# Patient Record
Sex: Female | Born: 1970 | Race: White | Hispanic: Yes | Marital: Married | Smoking: Never smoker
Health system: Southern US, Community
[De-identification: ages and names within clinical notes are randomized; demographics above are authoritative.]

## PROBLEM LIST (undated history)

## (undated) DIAGNOSIS — E785 Hyperlipidemia, unspecified: Secondary | ICD-10-CM

## (undated) DIAGNOSIS — R109 Unspecified abdominal pain: Secondary | ICD-10-CM

## (undated) DIAGNOSIS — R202 Paresthesia of skin: Secondary | ICD-10-CM

## (undated) HISTORY — DX: Unspecified abdominal pain: R10.9

## (undated) HISTORY — PX: TUBAL LIGATION: SHX77

## (undated) HISTORY — DX: Hyperlipidemia, unspecified: E78.5

## (undated) HISTORY — DX: Paresthesia of skin: R20.2

---

## 1998-01-20 ENCOUNTER — Inpatient Hospital Stay (HOSPITAL_COMMUNITY): Admission: AD | Admit: 1998-01-20 | Discharge: 1998-01-20 | Payer: Self-pay | Admitting: Obstetrics

## 1998-01-21 ENCOUNTER — Ambulatory Visit (HOSPITAL_COMMUNITY): Admission: RE | Admit: 1998-01-21 | Discharge: 1998-01-21 | Payer: Self-pay | Admitting: Obstetrics & Gynecology

## 1998-11-25 ENCOUNTER — Ambulatory Visit (HOSPITAL_COMMUNITY): Admission: AD | Admit: 1998-11-25 | Discharge: 1998-11-25 | Payer: Self-pay | Admitting: *Deleted

## 1999-01-21 ENCOUNTER — Ambulatory Visit (HOSPITAL_COMMUNITY): Admission: RE | Admit: 1999-01-21 | Discharge: 1999-01-21 | Payer: Self-pay | Admitting: *Deleted

## 1999-05-21 ENCOUNTER — Inpatient Hospital Stay (HOSPITAL_COMMUNITY): Admission: AD | Admit: 1999-05-21 | Discharge: 1999-05-23 | Payer: Self-pay | Admitting: *Deleted

## 1999-05-21 ENCOUNTER — Encounter (INDEPENDENT_AMBULATORY_CARE_PROVIDER_SITE_OTHER): Payer: Self-pay | Admitting: Specialist

## 2000-03-08 ENCOUNTER — Ambulatory Visit (HOSPITAL_COMMUNITY): Admission: RE | Admit: 2000-03-08 | Discharge: 2000-03-08 | Payer: Self-pay | Admitting: Family Medicine

## 2002-06-24 ENCOUNTER — Emergency Department (HOSPITAL_COMMUNITY): Admission: EM | Admit: 2002-06-24 | Discharge: 2002-06-24 | Payer: Self-pay | Admitting: Emergency Medicine

## 2002-06-24 ENCOUNTER — Encounter: Payer: Self-pay | Admitting: Emergency Medicine

## 2002-07-21 ENCOUNTER — Encounter: Payer: Self-pay | Admitting: Family Medicine

## 2002-07-21 ENCOUNTER — Encounter: Admission: RE | Admit: 2002-07-21 | Discharge: 2002-07-21 | Payer: Self-pay | Admitting: Family Medicine

## 2002-10-08 ENCOUNTER — Encounter: Payer: Self-pay | Admitting: Family Medicine

## 2002-10-08 ENCOUNTER — Encounter: Admission: RE | Admit: 2002-10-08 | Discharge: 2002-10-08 | Payer: Self-pay | Admitting: Family Medicine

## 2003-04-27 ENCOUNTER — Encounter: Admission: RE | Admit: 2003-04-27 | Discharge: 2003-04-27 | Payer: Self-pay | Admitting: Specialist

## 2003-04-27 ENCOUNTER — Encounter: Payer: Self-pay | Admitting: Specialist

## 2003-09-11 ENCOUNTER — Encounter: Admission: RE | Admit: 2003-09-11 | Discharge: 2003-09-11 | Payer: Self-pay | Admitting: Family Medicine

## 2004-06-14 ENCOUNTER — Ambulatory Visit (HOSPITAL_COMMUNITY): Admission: RE | Admit: 2004-06-14 | Discharge: 2004-06-14 | Payer: Self-pay | Admitting: Family Medicine

## 2006-01-24 ENCOUNTER — Encounter: Payer: Self-pay | Admitting: Family Medicine

## 2006-07-16 ENCOUNTER — Emergency Department (HOSPITAL_COMMUNITY): Admission: EM | Admit: 2006-07-16 | Discharge: 2006-07-16 | Payer: Self-pay | Admitting: Emergency Medicine

## 2007-04-23 ENCOUNTER — Encounter: Admission: RE | Admit: 2007-04-23 | Discharge: 2007-04-23 | Payer: Self-pay | Admitting: Family Medicine

## 2008-07-17 ENCOUNTER — Encounter: Admission: RE | Admit: 2008-07-17 | Discharge: 2008-07-17 | Payer: Self-pay | Admitting: Family Medicine

## 2009-01-13 IMAGING — CR DG NASAL BONES 3+V
3 series · 3 of 3 positions shown · non-contrast
Comparison: None

CLINICAL DATA: Indentation bridge of the nose

NASAL BONES - 3+ VIEW

[w waters *]
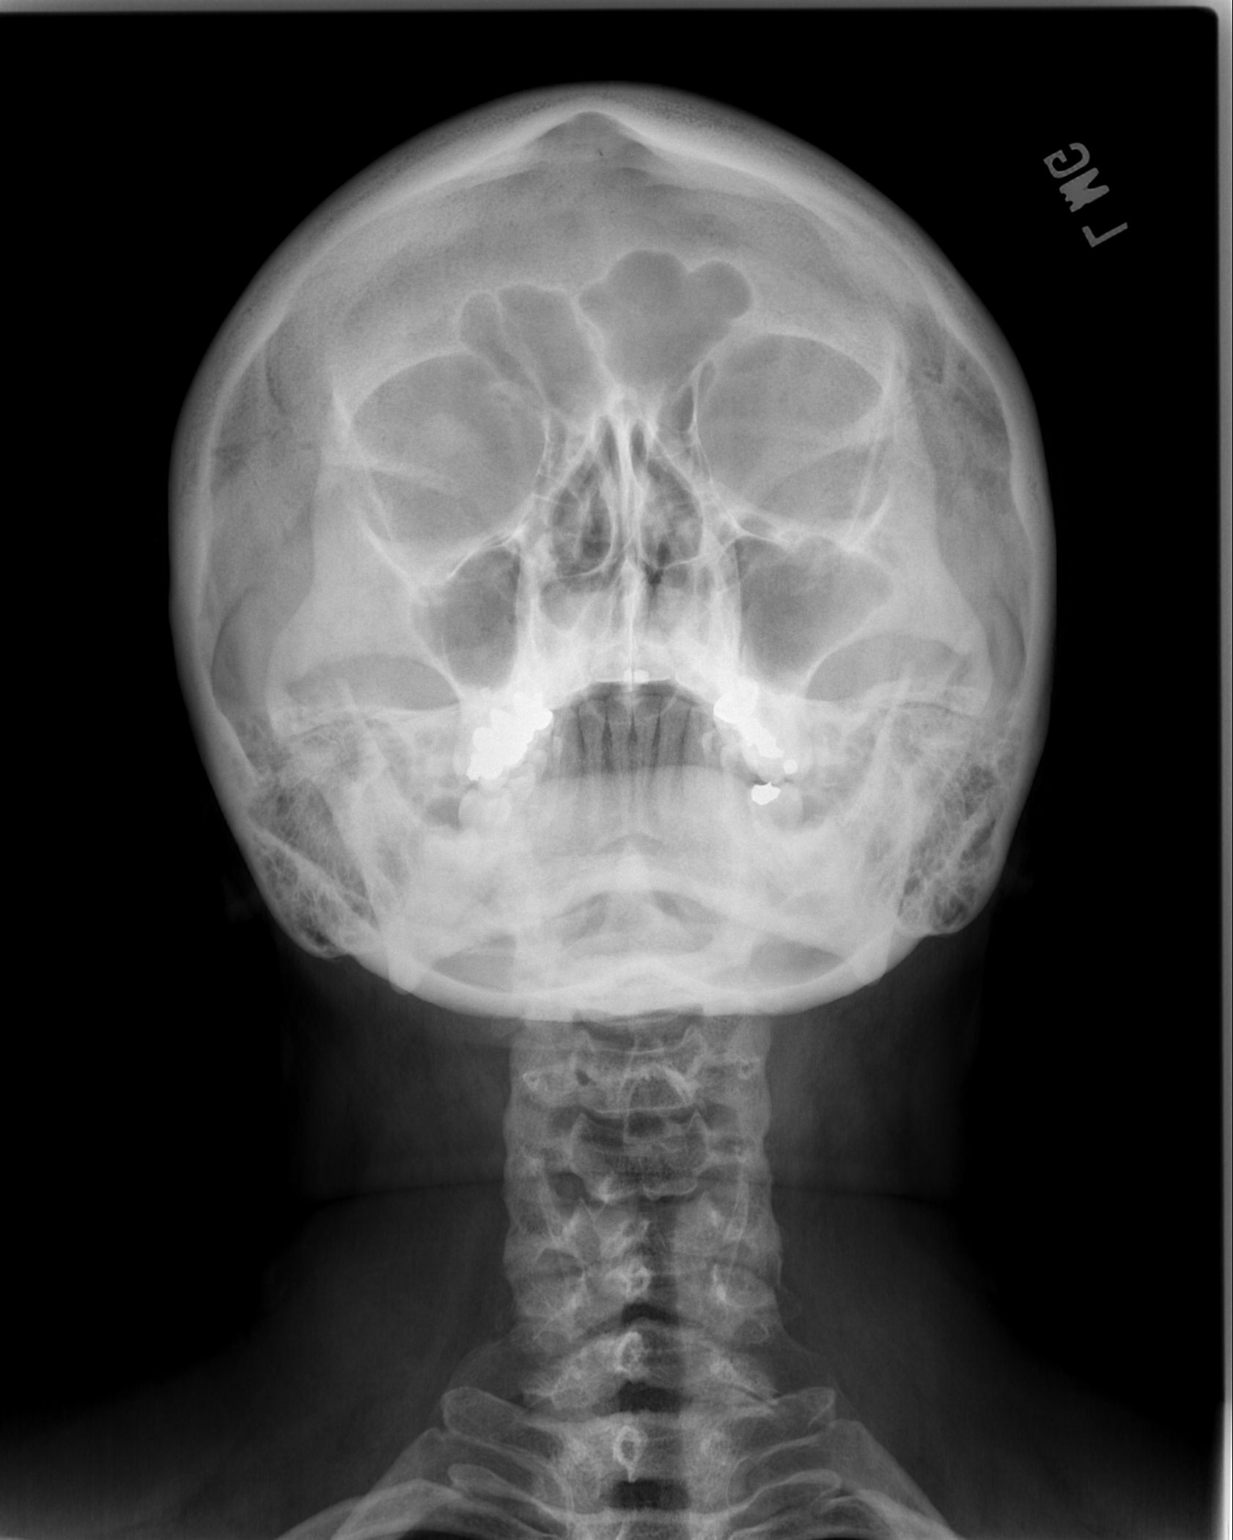

[w nasal bone lat * (1 of 2)]
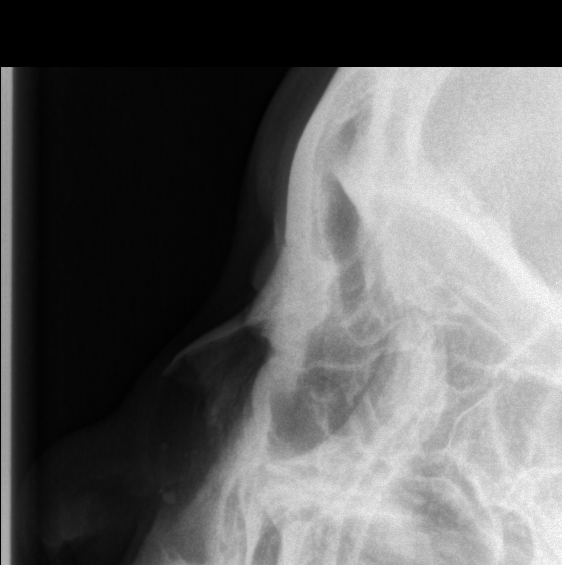

[w nasal bone lat * (2 of 2)]
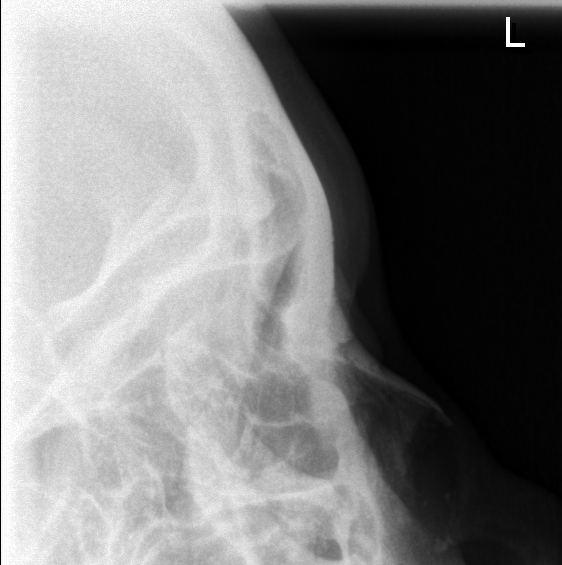

[3 of 3 positions shown; findings below may reference images not displayed]

FINDINGS: No traumatic fluid in the sinuses.  No evidence of nasal
fracture.
IMPRESSION: Negative

## 2010-09-11 ENCOUNTER — Emergency Department (HOSPITAL_COMMUNITY)
Admission: EM | Admit: 2010-09-11 | Discharge: 2010-09-11 | Payer: Self-pay | Source: Home / Self Care | Admitting: Emergency Medicine

## 2010-10-16 ENCOUNTER — Encounter: Payer: Self-pay | Admitting: Family Medicine

## 2014-12-29 ENCOUNTER — Other Ambulatory Visit: Payer: Self-pay | Admitting: Occupational Medicine

## 2014-12-29 ENCOUNTER — Emergency Department (HOSPITAL_COMMUNITY)
Admission: EM | Admit: 2014-12-29 | Discharge: 2014-12-29 | Disposition: A | Discharge status: Home / Self Care | Payer: Self-pay | Source: Home / Self Care

## 2014-12-29 ENCOUNTER — Ambulatory Visit: Payer: Self-pay

## 2014-12-29 DIAGNOSIS — R52 Pain, unspecified: Secondary | ICD-10-CM

## 2019-10-16 ENCOUNTER — Encounter: Payer: Self-pay | Admitting: Physician Assistant

## 2019-10-28 ENCOUNTER — Ambulatory Visit: Payer: Self-pay | Admitting: Physician Assistant

## 2019-11-03 ENCOUNTER — Telehealth: Payer: Self-pay | Admitting: *Deleted

## 2019-11-03 ENCOUNTER — Ambulatory Visit: Payer: BC Managed Care – PPO | Admitting: Physician Assistant

## 2019-11-03 ENCOUNTER — Encounter: Payer: Self-pay | Admitting: Physician Assistant

## 2019-11-03 ENCOUNTER — Other Ambulatory Visit: Payer: Self-pay

## 2019-11-03 VITALS — BP 96/66 | HR 68 | Temp 98.1°F | Ht 60.0 in | Wt 145.0 lb

## 2019-11-03 DIAGNOSIS — K5900 Constipation, unspecified: Secondary | ICD-10-CM | POA: Diagnosis not present

## 2019-11-03 DIAGNOSIS — R141 Gas pain: Secondary | ICD-10-CM | POA: Diagnosis not present

## 2019-11-03 DIAGNOSIS — R140 Abdominal distension (gaseous): Secondary | ICD-10-CM | POA: Diagnosis not present

## 2019-11-03 DIAGNOSIS — E785 Hyperlipidemia, unspecified: Secondary | ICD-10-CM | POA: Insufficient documentation

## 2019-11-03 DIAGNOSIS — Z01818 Encounter for other preprocedural examination: Secondary | ICD-10-CM

## 2019-11-03 DIAGNOSIS — R142 Eructation: Secondary | ICD-10-CM

## 2019-11-03 DIAGNOSIS — Z1211 Encounter for screening for malignant neoplasm of colon: Secondary | ICD-10-CM

## 2019-11-03 NOTE — Telephone Encounter (Signed)
Left message for patient to call office. Need pharmacy so we can send in Suprep for patient.

## 2019-11-03 NOTE — Patient Instructions (Addendum)
If you are age 49 or older, your body mass index should be between 23-30. Your Body mass index is 28.32 kg/m. If this is out of the aforementioned range listed, please consider follow up with your Primary Care Provider.  If you are age 33 or younger, your body mass index should be between 19-25. Your Body mass index is 28.32 kg/m. If this is out of the aformentioned range listed, please consider follow up with your Primary Care Provider.   We have sent the following medications to your pharmacy for you to pick up at your convenience: Protonix 40 mg daily.   Stop Carafate.   Try Beano 2-3 drops on food with each meal.  Continue Benefiber daily.   You have been scheduled for an endoscopy and colonoscopy. Please follow the written instructions given to you at your visit today. Please pick up your prep supplies at the pharmacy within the next 1-3 days. If you use inhalers (even only as needed), please bring them with you on the day of your procedure.

## 2019-11-03 NOTE — Progress Notes (Signed)
Subjective:    Patient ID: Laurie Valdez, female    DOB: 11/03/1970, 49 y.o.   MRN: [id number]  HPI Laurie Valdez is a pleasant 49 year old Hispanic female, new to GI today referred by Dr. Trula Ore with complaints of abdominal pain. Patient says she has had her current symptoms over about the past 5 years but feels that symptoms are becoming worse and now occurring on a daily basis.  She is primarily complaining of abdominal bloating postprandially associated with gas and belching.  She will at times have pain in her mid abdomen or in her right abdomen.  She denies any heartburn or dysphagia.  She says she was told she had ulcers at one point but has never had a GI evaluation.  It sounds as if she may have been tested for H. pylori at one point but does not recall whether or not she took antibiotics.  Appetite has been fine, weight has been stable.  She also has problems with chronic constipation.  She takes fiber supplements on a daily basis and usually has bowel movement every 1 to 2 days, no melena or hematochezia. She says she gets very uncomfortable with the abdominal bloating at times.  Symptoms are worse with spicy foods beings and cactus. She does not drink any carbonated beverages, no artificial sweeteners, avoids dairy and no EtOH.  Family history is negative for colon cancer and polyps as far she is aware. She has history of lumbar radiculopathy and had taken Voltaren pretty regularly at one point but is no longer using.  Review of Systems Pertinent positive and negative review of systems were noted in the above HPI section.  All other review of systems was otherwise negative.  Outpatient Encounter Medications as of 11/03/2019  Medication Sig  . diclofenac (VOLTAREN) 75 MG EC tablet Take 75 mg by mouth 2 (two) times daily. Takes as needed  . rosuvastatin (CRESTOR) 10 MG tablet Take 10 mg by mouth daily.  . sucralfate (CARAFATE) 1 g tablet Take 1 g by mouth 4 (four) times daily -   with meals and at bedtime. Patient only taking 1-2 times daily   No facility-administered encounter medications on file as of 11/03/2019.   No Known Allergies Patient Active Problem List   Diagnosis Date Noted  . Hyperlipidemia 11/03/2019   Social History   Socioeconomic History  . Marital status: Married    Spouse name: Not on file  . Number of children: Not on file  . Years of education: Not on file  . Highest education level: Not on file  Occupational History  . Not on file  Tobacco Use  . Smoking status: Never Smoker  . Smokeless tobacco: Never Used  Substance and Sexual Activity  . Alcohol use: Yes    Comment: occasional-rare  . Drug use: Never  . Sexual activity: Not on file  Other Topics Concern  . Not on file  Social History Narrative  . Not on file   Social Determinants of Health   Financial Resource Strain:   . Difficulty of Paying Living Expenses: Not on file  Food Insecurity:   . Worried About Charity fundraiser in the Last Year: Not on file  . Ran Out of Food in the Last Year: Not on file  Transportation Needs:   . Lack of Transportation (Medical): Not on file  . Lack of Transportation (Non-Medical): Not on file  Physical Activity:   . Days of Exercise per Week: Not on file  .  Minutes of Exercise per Session: Not on file  Stress:   . Feeling of Stress : Not on file  Social Connections:   . Frequency of Communication with Friends and Family: Not on file  . Frequency of Social Gatherings with Friends and Family: Not on file  . Attends Religious Services: Not on file  . Active Member of Clubs or Organizations: Not on file  . Attends Banker Meetings: Not on file  . Marital Status: Not on file  Intimate Partner Violence:   . Fear of Current or Ex-Partner: Not on file  . Emotionally Abused: Not on file  . Physically Abused: Not on file  . Sexually Abused: Not on file    Laurie Valdez's Family history is unknown by patient.       Objective:    Vitals:   11/03/19 1512  BP: 96/66  Pulse: 68  Temp: 98.1 F (36.7 C)    Physical Exam Well-developed well-nourished Hispanic in no acute distress.  Patient is accompanied by an interpreter  Weight, 145 BMI 28.3  HEENT; nontraumatic normocephalic, EOMI, PER R LA, sclera anicteric. Oropharynx; not examined Neck; supple, no JVD Cardiovascular; regular rate and rhythm with S1-S2, no murmur rub or gallop Pulmonary; Clear bilaterally Abdomen; soft, she is mildly tender in the epigastrium and right upper quadrant,, nondistended, no palpable mass or hepatosplenomegaly, bowel sounds are active Rectal; not done today Skin; benign exam, no jaundice rash or appreciable lesions Extremities; no clubbing cyanosis or edema skin warm and dry Neuro/Psych; alert and oriented x4, grossly nonfocal mood and affect appropriate       Assessment & Plan:   #5 49 year old non-English-speaking Hispanic female with 5-year history of somewhat progressive postprandial abdominal bloating and discomfort with gassiness and belching. Etiology not clear, rule out chronic gastropathy, H. pylori, functional dyspepsia, consider gallbladder disease.  Rule out IBS  #2 chronic mild constipation #3 colon cancer screening-no prior colonoscopy.  Plan; start low gas diet. Start trial of Protonix 40 mg p.o. every morning. Trial of Beano 2 to 3 drops on food with each meal Continue Benefiber for fiber supplementation. Patient will be scheduled for colonoscopy and EGD with Dr. Lavon Paganini .  Both procedures were discussed in detail with the patient including indications risks and benefits and she is agreeable to proceed. If endoscopic evaluation is unremarkable and symptoms persisting will need imaging with ultrasound initially   S  PA-C 11/03/2019   Cc: Aletha Halim, MD

## 2019-11-04 ENCOUNTER — Ambulatory Visit (INDEPENDENT_AMBULATORY_CARE_PROVIDER_SITE_OTHER): Payer: BC Managed Care – PPO

## 2019-11-04 ENCOUNTER — Other Ambulatory Visit: Payer: Self-pay

## 2019-11-04 DIAGNOSIS — Z1159 Encounter for screening for other viral diseases: Secondary | ICD-10-CM

## 2019-11-04 MED ORDER — NA SULFATE-K SULFATE-MG SULF 17.5-3.13-1.6 GM/177ML PO SOLN: 1.0000 | Freq: Once | ORAL | 0 refills | Status: AC

## 2019-11-04 MED ORDER — PANTOPRAZOLE SODIUM 40 MG PO TBEC: 40.0000 mg | DELAYED_RELEASE_TABLET | Freq: Every day | ORAL | 3 refills | Status: AC

## 2019-11-04 NOTE — Telephone Encounter (Signed)
Sent Protonix and Suprep to [facility]. Left message for patient to call office.

## 2019-11-05 ENCOUNTER — Telehealth: Payer: Self-pay | Admitting: Gastroenterology

## 2019-11-05 LAB — SARS CORONAVIRUS 2 (TAT 6-24 HRS): SARS Coronavirus 2: NEGATIVE

## 2019-11-05 NOTE — Telephone Encounter (Signed)
Informed patients husband that the free sample of Suprep is left at front desk for patient to pick up. Patient's husband verbalized understanding.

## 2019-11-05 NOTE — Telephone Encounter (Signed)
Patient called the office today asking for a different prep or a sample. Marchelle Folks spoke with patient's husband and informed him that we had a sample available and it was placed up front for her to pick up.

## 2019-11-06 ENCOUNTER — Encounter: Payer: Self-pay | Admitting: Gastroenterology

## 2019-11-06 ENCOUNTER — Other Ambulatory Visit: Payer: Self-pay

## 2019-11-06 ENCOUNTER — Ambulatory Visit (AMBULATORY_SURGERY_CENTER): Payer: BC Managed Care – PPO | Admitting: Gastroenterology

## 2019-11-06 VITALS — BP 113/70 | HR 68 | Resp 22 | Ht 60.0 in | Wt 145.0 lb

## 2019-11-06 DIAGNOSIS — K219 Gastro-esophageal reflux disease without esophagitis: Secondary | ICD-10-CM | POA: Diagnosis not present

## 2019-11-06 DIAGNOSIS — R1013 Epigastric pain: Secondary | ICD-10-CM

## 2019-11-06 DIAGNOSIS — R140 Abdominal distension (gaseous): Secondary | ICD-10-CM | POA: Diagnosis present

## 2019-11-06 DIAGNOSIS — Z1211 Encounter for screening for malignant neoplasm of colon: Secondary | ICD-10-CM | POA: Diagnosis not present

## 2019-11-06 MED ORDER — SODIUM CHLORIDE 0.9 % IV SOLN: 500.0000 mL | Freq: Once | INTRAVENOUS | Status: DC

## 2019-11-06 NOTE — Progress Notes (Signed)
Reviewed and agree with documentation and assessment and plan. K. Veena Jamison Yuhasz , MD   

## 2019-11-06 NOTE — Progress Notes (Signed)
PT taken to PACU. Monitors in place. VSS. Report given to RN. 

## 2019-11-06 NOTE — Op Note (Signed)
[facility] Patient Name: Laurie Valdez Procedure Date: 11/06/2019 8:00 AM MRN: [id number] Endoscopist: Napoleon Form , MD Age: 49 Referring MD:  Date of Birth: November 25, 1970 Gender: Female Account #: [id number] Procedure:                Colonoscopy Indications:              Screening for colorectal malignant neoplasm Medicines:                Monitored Anesthesia Care Procedure:                Pre-Anesthesia Assessment:                           - Prior to the procedure, a History and Physical                            was performed, and patient medications and                            allergies were reviewed. The patient's tolerance of                            previous anesthesia was also reviewed. The risks                            and benefits of the procedure and the sedation                            options and risks were discussed with the patient.                            All questions were answered, and informed consent                            was obtained. Prior Anticoagulants: The patient has                            taken no previous anticoagulant or antiplatelet                            agents. ASA Grade Assessment: II - A patient with                            mild systemic disease. After reviewing the risks                            and benefits, the patient was deemed in                            satisfactory condition to undergo the procedure.                           After obtaining informed consent, the colonoscope  was passed under direct vision. Throughout the                            procedure, the patient's blood pressure, pulse, and                            oxygen saturations were monitored continuously. The                            Colonoscope was introduced through the anus and                            advanced to the the cecum, identified by                            appendiceal  orifice and ileocecal valve. The                            colonoscopy was performed without difficulty. The                            patient tolerated the procedure well. The quality                            of the bowel preparation was excellent. The                            ileocecal valve, appendiceal orifice, and rectum                            were photographed. Scope In: 8:16:54 AM Scope Out: 8:34:38 AM Scope Withdrawal Time: 0 hours 10 minutes 5 seconds  Total Procedure Duration: 0 hours 17 minutes 44 seconds  Findings:                 The perianal and digital rectal examinations were                            normal.                           Non-bleeding internal hemorrhoids were found during                            retroflexion. The hemorrhoids were small.                           A few small-mouthed diverticula were found in the                            sigmoid colon.                           The exam was otherwise without abnormality. Complications:            No immediate complications. Estimated Blood Loss:  Estimated blood loss was minimal. Impression:               - Non-bleeding internal hemorrhoids.                           - Diverticulosis in the sigmoid colon.                           - The examination was otherwise normal.                           - No specimens collected. Recommendation:           - Patient has a contact number available for                            emergencies. The signs and symptoms of potential                            delayed complications were discussed with the                            patient. Return to normal activities tomorrow.                            Written discharge instructions were provided to the                            patient.                           - Resume previous diet.                           - Continue present medications.                           - Repeat colonoscopy in 10 years for  screening                            purposes. Napoleon Form, MD 11/06/2019 8:42:14 AM This report has been signed electronically.

## 2019-11-06 NOTE — Progress Notes (Signed)
Vitals-DT Temp-JB  History reviewed. 

## 2019-11-06 NOTE — Op Note (Signed)
[facility] Patient Name: Laurie Valdez Procedure Date: 11/06/2019 8:02 AM MRN: [id number] Endoscopist: Napoleon Form , MD Age: 49 Referring MD:  Date of Birth: 1970-11-02 Gender: Female Account #: [id number] Procedure:                Upper GI endoscopy Indications:              Dyspepsia, Esophageal reflux symptoms that persist                            despite appropriate therapy Medicines:                Monitored Anesthesia Care Procedure:                Pre-Anesthesia Assessment:                           - Prior to the procedure, a History and Physical                            was performed, and patient medications and                            allergies were reviewed. The patient's tolerance of                            previous anesthesia was also reviewed. The risks                            and benefits of the procedure and the sedation                            options and risks were discussed with the patient.                            All questions were answered, and informed consent                            was obtained. Prior Anticoagulants: The patient has                            taken no previous anticoagulant or antiplatelet                            agents. ASA Grade Assessment: II - A patient with                            mild systemic disease. After reviewing the risks                            and benefits, the patient was deemed in                            satisfactory condition to undergo the procedure.  After obtaining informed consent, the endoscope was                            passed under direct vision. Throughout the                            procedure, the patient's blood pressure, pulse, and                            oxygen saturations were monitored continuously. The                            Endoscope was introduced through the mouth, and                            advanced to  the second part of duodenum. The upper                            GI endoscopy was accomplished without difficulty.                            The patient tolerated the procedure well. Scope In: Scope Out: Findings:                 The esophagus was normal.                           The Z-line was regular and was found 35 cm from the                            incisors.                           A small hiatal hernia was present.                           The stomach was normal.                           The cardia and gastric fundus were normal on                            retroflexion.                           The examined duodenum was normal. Complications:            No immediate complications. Estimated Blood Loss:     Estimated blood loss: none. Impression:               - Normal esophagus.                           - Z-line regular, 35 cm from the incisors.                           - Small hiatal hernia.                           -  Normal stomach.                           - Normal examined duodenum.                           - No specimens collected. Recommendation:           - Patient has a contact number available for                            emergencies. The signs and symptoms of potential                            delayed complications were discussed with the                            patient. Return to normal activities tomorrow.                            Written discharge instructions were provided to the                            patient.                           - Resume previous diet.                           - Continue present medications.                           - See the other procedure note for documentation of                            additional recommendations. [name], MD 11/06/2019 8:40:43 AM This report has been signed electronically.

## 2019-11-06 NOTE — Patient Instructions (Signed)
Information on diverticulosis and hemorrhoids given to you today.  Repeat colonoscopy in 10 years.  USTED TUVO UN PROCEDIMIENTO ENDOSCPICO HOY EN EL [facility]:   Lea el informe del procedimiento que se le entreg para cualquier pregunta especfica sobre lo que se Dentist.  Si el informe del examen no responde a sus preguntas, por favor llame a su gastroenterlogo para aclararlo.  Si usted solicit que no se le den [employer] de lo que se Clinical cytogeneticist en su procedimiento al [employer] va a cuidar, entonces el informe del procedimiento se ha incluido en un sobre sellado para que usted lo revise despus cuando le sea ms conveniente.   LO QUE PUEDE ESPERAR: Algunas sensaciones de hinchazn en el abdomen.  Puede tener ms gases de lo normal.  El caminar puede ayudarle a eliminar el aire que se le puso en el tracto gastrointestinal durante el procedimiento y reducir la hinchazn.  Si le hicieron una endoscopia inferior (como una colonoscopia o una sigmoidoscopia flexible), podra notar manchas de sangre en las heces fecales o en el papel higinico.  Si se someti a una preparacin intestinal para su procedimiento, es posible que no tenga una evacuacin intestinal normal durante [employer].   Tenga en cuenta:  Es posible que note un poco de irritacin y congestin en la nariz o algn drenaje.  Esto es debido al oxgeno [employer] durante su procedimiento.  No hay que preocuparse y esto debe desaparecer ms o Regulatory affairs officer.   SNTOMAS PARA REPORTAR INMEDIATAMENTE:  Despus de una endoscopia inferior (colonoscopia o sigmoidoscopia flexible):  Cantidades excesivas de sangre en las heces fecales  Sensibilidad significativa o empeoramiento de los dolores abdominales   Hinchazn aguda del abdomen que antes no tena   Fiebre de 100F o ms   Despus de la endoscopia superior (EGD)  Vmitos de Retail buyer o material como caf molido   Dolor en el pecho o dolor debajo de los  omplatos que antes no tena   Dolor o dificultad persistente para tragar  Falta de aire que antes no tena   Fiebre de 100F o ms  Heces fecales negras y pegajosas   Para asuntos urgentes o de Associate Professor, puede comunicarse con un gastroenterlogo a cualquier hora llamando al [phone].  DIETA:  Recomendamos una comida pequea al principio, pero luego puede continuar con su dieta normal.  Tome muchos lquidos, Tax adviser las bebidas alcohlicas durante 24 horas.    ACTIVIDAD:  Debe planear tomarse las cosas con calma por el resto del da y no debe CONDUCIR ni usar maquinaria pesada Patent examiner (debido a los medicamentos de sedacin utilizados durante el examen).     SEGUIMIENTO: Nuestro personal llamar al nmero que aparece en su historial al siguiente da hbil de su procedimiento para ver cmo se siente y para responder cualquier pregunta o inquietud que pueda tener con respecto a la informacin que se le dio despus del procedimiento. Si no podemos contactarle, le dejaremos un mensaje.  Sin embargo, si se siente bien y no tiene English as a second language teacher, no es necesario que nos devuelva la llamada.  Asumiremos que ha regresado a sus actividades diarias normales sin incidentes. Si se le tomaron algunas biopsias, le contactaremos por telfono o por carta en las prximas 3 semanas.  Si no ha sabido [employer] biopsias en el transcurso de 3 semanas, por favor llmenos al [phone].   FIRMAS/CONFIDENCIALIDAD: Usted y/o el acompaante que le cuide [place]  firmado documentos que se ingresarn en su historial mdico electrnico.  Estas firmas atestiguan el hecho de que la informacin anterior

## 2019-11-10 ENCOUNTER — Telehealth: Payer: Self-pay | Admitting: *Deleted

## 2019-11-10 ENCOUNTER — Telehealth: Payer: Self-pay

## 2019-11-10 NOTE — Telephone Encounter (Signed)
Attempted f/u phone call. No answer. Mailbox full, unable to leave message.  

## 2019-11-10 NOTE — Telephone Encounter (Signed)
  Follow up Call-  Call back number 11/06/2019  Post procedure Call Back phone  # [phone]  Permission to leave phone message Yes  Some recent data might be hidden     Patient questions:  Do you have a fever, pain , or abdominal swelling? No. Pain Score  0 *  Have you tolerated food without any problems? Yes.    Have you been able to return to your normal activities? Yes.    Do you have any questions about your discharge instructions: Diet   No. Medications  No. Follow up visit  No.  Do you have questions or concerns about your Care? No.  Actions: * If pain score is 4 or above: No action needed, pain <4.  1. Have you developed a fever since your procedure? no  2.   Have you had an respiratory symptoms (SOB or cough) since your procedure? no  3.   Have you tested positive for COVID 19 since your procedure no  4.   Have you had any family members/close contacts diagnosed with the COVID 19 since your procedure?  no   If yes to any of these questions please route to Laverna Peace, RN and Jennye Boroughs, Charity fundraiser.

## 2020-12-27 ENCOUNTER — Ambulatory Visit (HOSPITAL_COMMUNITY)
Admission: EM | Admit: 2020-12-27 | Discharge: 2020-12-27 | Disposition: A | Discharge status: Home / Self Care | Payer: BC Managed Care – PPO | Attending: Emergency Medicine | Admitting: Emergency Medicine

## 2020-12-27 ENCOUNTER — Encounter (HOSPITAL_COMMUNITY): Payer: Self-pay

## 2020-12-27 ENCOUNTER — Other Ambulatory Visit: Payer: Self-pay

## 2020-12-27 DIAGNOSIS — Z79899 Other long term (current) drug therapy: Secondary | ICD-10-CM | POA: Diagnosis not present

## 2020-12-27 DIAGNOSIS — R051 Acute cough: Secondary | ICD-10-CM | POA: Insufficient documentation

## 2020-12-27 DIAGNOSIS — Z20822 Contact with and (suspected) exposure to covid-19: Secondary | ICD-10-CM | POA: Insufficient documentation

## 2020-12-27 DIAGNOSIS — J0100 Acute maxillary sinusitis, unspecified: Secondary | ICD-10-CM | POA: Insufficient documentation

## 2020-12-27 DIAGNOSIS — J029 Acute pharyngitis, unspecified: Secondary | ICD-10-CM | POA: Insufficient documentation

## 2020-12-27 LAB — SARS CORONAVIRUS 2 (TAT 6-24 HRS): SARS Coronavirus 2: NEGATIVE

## 2020-12-27 MED ORDER — AMOXICILLIN 875 MG PO TABS: 875.0000 mg | ORAL_TABLET | Freq: Two times a day (BID) | ORAL | 0 refills | Status: AC

## 2020-12-27 NOTE — ED Provider Notes (Signed)
[facility]    CSN: [id number] Arrival date & time: 12/27/20  1053      History   Chief Complaint Chief Complaint  Patient presents with  . Cough  . Sore Throat    HPI Laurie Valdez is a 50 y.o. female.   Patient presents with 1 week history of fever, sore throat, nasal congestion, maxillary sinus pressure, postnasal drip, cough.  She has not taken her temperature but felt warm.  Treatment attempted at home with Allegra and Tylenol.  She denies rash, shortness of breath, vomiting, diarrhea, or other symptoms.  She denies pertinent medical history.  The history is provided by the patient.    Past Medical History:  Diagnosis Date  . Abdominal pain   . Hyperlipidemia   . Paresthesia     Patient Active Problem List   Diagnosis Date Noted  . Hyperlipidemia 11/03/2019    Past Surgical History:  Procedure Laterality Date  . TUBAL LIGATION      OB History   No obstetric history on file.      Home Medications    Prior to Admission medications   Medication Sig Start Date End Date Taking? Authorizing Provider  acetaminophen (TYLENOL) 500 MG tablet Take 500 mg by mouth every 6 (six) hours as needed.   Yes [provider]  amoxicillin (AMOXIL) 875 MG tablet Take 1 tablet (875 mg total) by mouth 2 (two) times daily for 7 days. 12/27/20 01/03/21 Yes Mickie Bail, NP  fexofenadine (ALLEGRA) 180 MG tablet Take 180 mg by mouth daily.   Yes [provider]  diclofenac (VOLTAREN) 75 MG EC tablet Take 75 mg by mouth 2 (two) times daily. Takes as needed    [provider]  pantoprazole (PROTONIX) 40 MG tablet Take 1 tablet (40 mg total) by mouth daily. 11/04/19   Esterwood, Amy S, PA-C  rosuvastatin (CRESTOR) 10 MG tablet Take 10 mg by mouth daily. 09/18/19   [provider]    Family History Family History  Problem Relation Age of Onset  . Colon cancer Neg Hx   . Esophageal cancer Neg Hx   . Rectal cancer Neg Hx   .  Stomach cancer Neg Hx     Social History Social History   Tobacco Use  . Smoking status: Never Smoker  . Smokeless tobacco: Never Used  Vaping Use  . Vaping Use: Never used  Substance Use Topics  . Alcohol use: Yes    Comment: occasional-rare  . Drug use: Never     Allergies   Patient has no known allergies.   Review of Systems Review of Systems  Constitutional: Positive for fever. Negative for chills.  HENT: Positive for congestion, postnasal drip, sinus pressure and sore throat. Negative for ear pain.   Eyes: Negative for pain and visual disturbance.  Respiratory: Positive for cough. Negative for shortness of breath.   Cardiovascular: Negative for chest pain and palpitations.  Gastrointestinal: Negative for abdominal pain, diarrhea and vomiting.  Genitourinary: Negative for dysuria and hematuria.  Musculoskeletal: Negative for arthralgias and back pain.  Skin: Negative for color change and rash.  Neurological: Negative for seizures and syncope.  All other systems reviewed and are negative.    Physical Exam Triage Vital Signs ED Triage Vitals  Enc Vitals Group     BP      Pulse      Resp      Temp      Temp src  SpO2      Weight      Height      Head Circumference      Peak Flow      Pain Score      Pain Loc      Pain Edu?      Excl. in GC?    No data found.  Updated Vital Signs BP 100/60 (BP Location: Right Arm)   Pulse 85   Temp 98.9 F (37.2 C) (Oral)   Resp 18   LMP  (Approximate)   SpO2 99%   Visual Acuity Right Eye Distance:   Left Eye Distance:   Bilateral Distance:    Right Eye Near:   Left Eye Near:    Bilateral Near:     Physical Exam Vitals and nursing note reviewed.  Constitutional:      General: She is not in acute distress.    Appearance: She is well-developed.  HENT:     Head: Normocephalic and atraumatic.     Right Ear: Tympanic membrane normal.     Left Ear: Tympanic membrane normal.     Nose: Congestion  present.     Mouth/Throat:     Mouth: Mucous membranes are moist.     Pharynx: Oropharynx is clear.     Tonsils: 0 on the right. 0 on the left.  Eyes:     Conjunctiva/sclera: Conjunctivae normal.  Cardiovascular:     Rate and Rhythm: Normal rate and regular rhythm.     Heart sounds: Normal heart sounds.  Pulmonary:     Effort: Pulmonary effort is normal. No respiratory distress.     Breath sounds: Normal breath sounds.  Abdominal:     Palpations: Abdomen is soft.     Tenderness: There is no abdominal tenderness.  Musculoskeletal:     Cervical back: Neck supple.  Skin:    General: Skin is warm and dry.  Neurological:     General: No focal deficit present.     Mental Status: She is alert and oriented to person, place, and time.  Psychiatric:        Mood and Affect: Mood normal.        Behavior: Behavior normal.      UC Treatments / Results  Labs (all labs ordered are listed, but only abnormal results are displayed) Labs Reviewed  SARS CORONAVIRUS 2 (TAT 6-24 HRS)    EKG   Radiology No results found.  Procedures Procedures (including critical care time)  Medications Ordered in UC Medications - No data to display  Initial Impression / Assessment and Plan / UC Course  I have reviewed the triage vital signs and the nursing notes.  Pertinent labs & imaging results that were available during my care of the patient were reviewed by me and considered in my medical decision making (see chart for details).   Acute maxillary sinusitis.  Treating with amoxicillin.  COVID pending.  Instructed patient to self quarantine until the test results are back.  Discussed symptomatic treatment including Tylenol, rest, hydration.  Instructed patient to follow up with PCP if her symptoms are not improving.  Patient agrees to plan of care.    Final Clinical Impressions(s) / UC Diagnoses   Final diagnoses:  Acute non-recurrent maxillary sinusitis     Discharge Instructions      Take the amoxicillin as directed.    Your COVID test is pending.  You should self quarantine until the test result is back.    Take  Tylenol or ibuprofen as needed for fever or discomfort.  Rest and keep yourself hydrated.    Follow-up with your primary care provider if your symptoms are not improving.        ED Prescriptions    Medication Sig Dispense Auth. Provider   amoxicillin (AMOXIL) 875 MG tablet Take 1 tablet (875 mg total) by mouth 2 (two) times daily for 7 days. 14 tablet Mickie Bail, NP     PDMP not reviewed this encounter.   Mickie Bail, NP 12/27/20 1234

## 2020-12-27 NOTE — ED Triage Notes (Signed)
Pt presents with cough, sore trhoat and fever x 1 week. Allegra and Tylenol gives no relief.

## 2020-12-27 NOTE — Discharge Instructions (Signed)
Take the amoxicillin as directed.    Your COVID test is pending.  You should self quarantine until the test result is back.    Take Tylenol or ibuprofen as needed for fever or discomfort.  Rest and keep yourself hydrated.    Follow-up with your primary care provider if your symptoms are not improving.

## 2023-04-02 ENCOUNTER — Other Ambulatory Visit: Payer: Self-pay | Admitting: *Deleted

## 2023-04-02 DIAGNOSIS — I8393 Asymptomatic varicose veins of bilateral lower extremities: Secondary | ICD-10-CM

## 2023-04-17 ENCOUNTER — Ambulatory Visit: Payer: BC Managed Care – PPO | Admitting: Vascular Surgery

## 2023-04-17 ENCOUNTER — Encounter: Payer: Self-pay | Admitting: Vascular Surgery

## 2023-04-17 ENCOUNTER — Ambulatory Visit (HOSPITAL_COMMUNITY)
Admission: RE | Admit: 2023-04-17 | Discharge: 2023-04-17 | Disposition: A | Discharge status: Home / Self Care | Payer: BC Managed Care – PPO | Source: Ambulatory Visit | Attending: Vascular Surgery | Admitting: Vascular Surgery

## 2023-04-17 VITALS — BP 123/76 | HR 55 | Temp 97.7°F | Resp 14 | Ht 60.0 in | Wt 139.0 lb

## 2023-04-17 DIAGNOSIS — I872 Venous insufficiency (chronic) (peripheral): Secondary | ICD-10-CM | POA: Diagnosis not present

## 2023-04-17 DIAGNOSIS — I8393 Asymptomatic varicose veins of bilateral lower extremities: Secondary | ICD-10-CM | POA: Insufficient documentation

## 2023-04-17 NOTE — Progress Notes (Signed)
Patient name: Laurie Valdez MRN: [id number] DOB: 08/30/1971 Sex: female  REASON FOR CONSULT: Varicose Veins  HPI: Laurie Valdez [name] is a 52 y.o. female, with history of hyperlipidemia that presents for evaluation of varicose veins.  Patient states she has had pain particularly in her calves.  She describes it as an aching pain and worse in the right calf.  She states this is also worse when she bends down.  She is not wearing compression stockings.  No history of DVT.  No prior venous interventions.  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  Past Medical History:  Diagnosis Date   Abdominal pain    Hyperlipidemia    Paresthesia     Past Surgical History:  Procedure Laterality Date   TUBAL LIGATION      Family History  Problem Relation Age of Onset   Colon cancer Neg Hx    Esophageal cancer Neg Hx    Rectal cancer Neg Hx    Stomach cancer Neg Hx     SOCIAL HISTORY: Social History   Socioeconomic History   Marital status: Married    Spouse name: Not on file   Number of children: Not on file   Years of education: Not on file   Highest education level: Not on file  Occupational History   Not on file  Tobacco Use   Smoking status: Never   Smokeless tobacco: Never  Vaping Use   Vaping status: Never Used  Substance and Sexual Activity   Alcohol use: Yes    Comment: occasional-rare   Drug use: Never   Sexual activity: Not on file  Other Topics Concern   Not on file  Social History Narrative   Not on file   Social Determinants of Health   Financial Resource Strain: Not on file  Food Insecurity: Not on file  Transportation Needs: Not on file  Physical Activity: Not on file  Stress: Not on file  Social Connections: Not on file  Intimate Partner Violence: Not on file    No Known Allergies  Current Outpatient Medications  Medication Sig Dispense Refill   acetaminophen (TYLENOL) 500 MG tablet Take 500  mg by mouth every 6 (six) hours as needed.     diclofenac (VOLTAREN) 75 MG EC tablet Take 75 mg by mouth 2 (two) times daily. Takes as needed     fexofenadine (ALLEGRA) 180 MG tablet Take 180 mg by mouth daily.     pantoprazole (PROTONIX) 40 MG tablet Take 1 tablet (40 mg total) by mouth daily. 90 tablet 3   rosuvastatin (CRESTOR) 10 MG tablet Take 10 mg by mouth daily.     No current facility-administered medications for this visit.    REVIEW OF SYSTEMS:  [X]  denotes positive finding, [ ]  denotes negative finding Cardiac  Comments:  Chest pain or chest pressure:    Shortness of breath upon exertion:    Short of breath when lying flat:    Irregular heart rhythm:        Vascular    Pain in calf, thigh, or hip brought on by ambulation:    Pain in feet at night that wakes you up from your sleep:     Blood clot in your veins:    Leg swelling:         Pulmonary    Oxygen at home:    Productive cough:     Wheezing:  Neurologic    Sudden weakness in arms or legs:     Sudden numbness in arms or legs:     Sudden onset of difficulty speaking or slurred speech:    Temporary loss of vision in one eye:     Problems with dizziness:         Gastrointestinal    Blood in stool:     Vomited blood:         Genitourinary    Burning when urinating:     Blood in urine:        Psychiatric    Major depression:         Hematologic    Bleeding problems:    Problems with blood clotting too easily:        Skin    Rashes or ulcers:        Constitutional    Fever or chills:      PHYSICAL EXAM: Vitals:   04/17/23 1431  BP: 123/76  Pulse: (!) 55  Resp: 14  Temp: 97.7 F (36.5 C)  TempSrc: Temporal  SpO2: 97%  Weight: 139 lb (63 kg)  Height: 5' (1.524 m)    GENERAL: The patient is a well-nourished female, in no acute distress. The vital signs are documented above. CARDIAC: There is a regular rate and rhythm.  VASCULAR:  Bilateral femoral pulses palpable Bilateral DP  pulses palpable Bilateral lower extremity spider veins More tenderness in the right leg over this great saphenous vein No significant venous stasis changes PULMONARY: No respiratory distress. ABDOMEN: Soft and non-tender. MUSCULOSKELETAL: There are no major deformities or cyanosis. NEUROLOGIC: No focal weakness or paresthesias are detected. PSYCHIATRIC: The patient has a normal affect.  DATA:   Lower Venous Reflux Study   Patient Name:  Laurie Valdez  Date of Exam:   04/17/2023  Medical Rec #: [id number]                Accession #:    [id number]  Date of Birth: 10-30-1970                 Patient Gender: F  Patient Age:   62 years  Exam Location:  [facility]  Procedure:      VAS [place] LOWER EXTREMITY VENOUS REFLUX  Referring Phys: Sherald Hess    ---------------------------------------------------------------------------  -----    Indications: Edema, and varicosities.    Comparison Study: No prior study   Performing Technologist: Gertie Fey MHA, RDMS, RVT, RDCS     Examination Guidelines: A complete evaluation includes B-mode imaging,  spectral  Doppler, color Doppler, and power Doppler as needed of all accessible  portions  of each vessel. Bilateral testing is considered an integral part of a  complete  examination. Limited examinations for reoccurring indications may be  performed  as noted. The reflux portion of the exam is performed with the patient in  reverse Trendelenburg.  Significant venous reflux is defined as >500 ms in the superficial venous  system, and >1 second in the deep venous system.     Venous Reflux Times  +--------------+---------+------+-----------+------------+-------------+  RIGHT        Reflux NoRefluxReflux TimeDiameter cmsComments                               Yes                                        +--------------+---------+------+-----------+------------+-------------+  CFV                     yes   >1 second                            +--------------+---------+------+-----------+------------+-------------+  FV mid        no                                                   +--------------+---------+------+-----------+------------+-------------+  Popliteal    no                                                   +--------------+---------+------+-----------+------------+-------------+  GSV at SFJ              yes    >500 ms      1.02                   +--------------+---------+------+-----------+------------+-------------+  GSV prox thigh          yes    >500 ms      0.59                   +--------------+---------+------+-----------+------------+-------------+  GSV mid thigh           yes    >500 ms      0.55                   +--------------+---------+------+-----------+------------+-------------+  GSV dist thigh          yes    >500 ms      1.28                   +--------------+---------+------+-----------+------------+-------------+  GSV at knee             yes    >500 ms      0.63                   +--------------+---------+------+-----------+------------+-------------+  GSV prox calf           yes    >500 ms      0.51    out of fascia  +--------------+---------+------+-----------+------------+-------------+  SSV Pop Fossa           yes    >500 ms      0.29                   +--------------+---------+------+-----------+------------+-------------+  SSV prox calf           yes    >500 ms      0.23                   +--------------+---------+------+-----------+------------+-------------+      Summary:  Right:  - No evidence of deep vein thrombosis seen in the right lower extremity,  from the common femoral through the popliteal veins.  - No evidence of superficial venous thrombosis in the right lower  extremity.    - The deep venous system is incompetent at the CFV.  - The great saphenous  vein is grossly incompetent.  - The small saphenous vein is  incompetent.   *See table(s) above for measurements and observations.    Assessment/Plan:  52 year old female that presents for evaluation of pain and discomfort in her lower extremities.  She describes this is an aching discomfort and on exam she has more tenderness along the great saphenous vein in the calf with spider and reticular veins on exam.  She has evidence of chronic venous insufficiency with valvular reflux as demonstrated above.  I discussed elevating her legs as well as exercise and also got her sized for thigh-high medical grade compression stockings.  I discussed she wear these on a daily basis.  I will have her follow-up in 3 months with one of my partners and discussed if she gets better with conservative therapy there may be further intervention as she has a large refluxing great saphenous vein as pictured above.   Cephus Shelling, MD [facility] Office: [phone]

## 2023-07-16 ENCOUNTER — Ambulatory Visit: Payer: BC Managed Care – PPO | Admitting: Surgery

## 2024-10-07 ENCOUNTER — Other Ambulatory Visit: Payer: Self-pay | Admitting: Family Medicine

## 2024-10-07 ENCOUNTER — Ambulatory Visit
Admission: RE | Admit: 2024-10-07 | Discharge: 2024-10-07 | Disposition: A | Source: Ambulatory Visit | Attending: Family Medicine | Admitting: Family Medicine

## 2024-10-07 DIAGNOSIS — M19041 Primary osteoarthritis, right hand: Secondary | ICD-10-CM
